# Patient Record
Sex: Male | Born: 1995 | Race: White | Hispanic: No | Marital: Single | Smoking: Former smoker
Health system: Southern US, Community
[De-identification: ages and names within clinical notes are randomized; demographics above are authoritative.]

## PROBLEM LIST (undated history)

## (undated) DIAGNOSIS — N200 Calculus of kidney: Secondary | ICD-10-CM

---
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
## 2014-03-06 ENCOUNTER — Emergency Department (HOSPITAL_BASED_OUTPATIENT_CLINIC_OR_DEPARTMENT_OTHER)
Admission: EM | Admit: 2014-03-06 | Discharge: 2014-03-06 | Disposition: A | Discharge status: Home / Self Care | Payer: Self-pay | Attending: Emergency Medicine | Admitting: Emergency Medicine

## 2014-03-06 ENCOUNTER — Emergency Department (HOSPITAL_BASED_OUTPATIENT_CLINIC_OR_DEPARTMENT_OTHER): Payer: Self-pay

## 2014-03-06 ENCOUNTER — Encounter (HOSPITAL_BASED_OUTPATIENT_CLINIC_OR_DEPARTMENT_OTHER): Payer: Self-pay | Admitting: *Deleted

## 2014-03-06 ENCOUNTER — Emergency Department (HOSPITAL_BASED_OUTPATIENT_CLINIC_OR_DEPARTMENT_OTHER): Payer: Medicaid Other

## 2014-03-06 DIAGNOSIS — N23 Unspecified renal colic: Secondary | ICD-10-CM | POA: Insufficient documentation

## 2014-03-06 DIAGNOSIS — Z791 Long term (current) use of non-steroidal anti-inflammatories (NSAID): Secondary | ICD-10-CM | POA: Insufficient documentation

## 2014-03-06 DIAGNOSIS — Z87891 Personal history of nicotine dependence: Secondary | ICD-10-CM | POA: Insufficient documentation

## 2014-03-06 DIAGNOSIS — R109 Unspecified abdominal pain: Secondary | ICD-10-CM

## 2014-03-06 DIAGNOSIS — Z79899 Other long term (current) drug therapy: Secondary | ICD-10-CM | POA: Insufficient documentation

## 2014-03-06 LAB — CBC WITH DIFFERENTIAL/PLATELET
Basophils Absolute: 0 10*3/uL (ref 0.0–0.1)
Basophils Relative: 0 % (ref 0–1)
Eosinophils Absolute: 0 10*3/uL (ref 0.0–0.7)
Eosinophils Relative: 0 % (ref 0–5)
HEMATOCRIT: 43.3 % (ref 39.0–52.0)
HEMOGLOBIN: 14.7 g/dL (ref 13.0–17.0)
LYMPHS PCT: 19 % (ref 12–46)
Lymphs Abs: 1.4 10*3/uL (ref 0.7–4.0)
MCH: 29.9 pg (ref 26.0–34.0)
MCHC: 33.9 g/dL (ref 30.0–36.0)
MCV: 88 fL (ref 78.0–100.0)
Monocytes Absolute: 0.5 10*3/uL (ref 0.1–1.0)
Monocytes Relative: 7 % (ref 3–12)
NEUTROS ABS: 5.3 10*3/uL (ref 1.7–7.7)
Neutrophils Relative %: 74 % (ref 43–77)
Platelets: 305 10*3/uL (ref 150–400)
RBC: 4.92 MIL/uL (ref 4.22–5.81)
RDW: 12.5 % (ref 11.5–15.5)
WBC: 7.4 10*3/uL (ref 4.0–10.5)

## 2014-03-06 LAB — COMPREHENSIVE METABOLIC PANEL
ALBUMIN: 4.2 g/dL (ref 3.5–5.2)
ALK PHOS: 68 U/L (ref 39–117)
ALT: 11 U/L (ref 0–53)
AST: 20 U/L (ref 0–37)
Anion gap: 8 (ref 5–15)
BILIRUBIN TOTAL: 0.8 mg/dL (ref 0.3–1.2)
BUN: 10 mg/dL (ref 6–23)
CHLORIDE: 104 meq/L (ref 96–112)
CO2: 26 mmol/L (ref 19–32)
CREATININE: 0.75 mg/dL (ref 0.50–1.35)
Calcium: 8.4 mg/dL (ref 8.4–10.5)
GLUCOSE: 126 mg/dL — AB (ref 70–99)
POTASSIUM: 3.4 mmol/L — AB (ref 3.5–5.1)
Sodium: 138 mmol/L (ref 135–145)
Total Protein: 7.3 g/dL (ref 6.0–8.3)

## 2014-03-06 LAB — URINALYSIS, ROUTINE W REFLEX MICROSCOPIC
BILIRUBIN URINE: NEGATIVE
Glucose, UA: NEGATIVE mg/dL
KETONES UR: NEGATIVE mg/dL
Leukocytes, UA: NEGATIVE
NITRITE: NEGATIVE
Protein, ur: NEGATIVE mg/dL
SPECIFIC GRAVITY, URINE: 1.014 (ref 1.005–1.030)
UROBILINOGEN UA: 0.2 mg/dL (ref 0.0–1.0)
pH: 7 (ref 5.0–8.0)

## 2014-03-06 LAB — URINE MICROSCOPIC-ADD ON

## 2014-03-06 MED ORDER — KETOROLAC TROMETHAMINE 30 MG/ML IJ SOLN
30.0000 mg | Freq: Once | INTRAMUSCULAR | Status: AC
  Administered 2014-03-06: 30 mg via INTRAVENOUS
  Filled 2014-03-06: qty 1

## 2014-03-06 MED ORDER — HYDROMORPHONE HCL 1 MG/ML IJ SOLN
1.0000 mg | Freq: Once | INTRAMUSCULAR | Status: AC
  Administered 2014-03-06: 1 mg via INTRAVENOUS
  Filled 2014-03-06: qty 1

## 2014-03-06 MED ORDER — OXYCODONE-ACETAMINOPHEN 5-325 MG PO TABS: 2.0000 | ORAL_TABLET | ORAL | Status: AC | PRN

## 2014-03-06 MED ORDER — SODIUM CHLORIDE 0.9 % IV BOLUS (SEPSIS)
1000.0000 mL | Freq: Once | INTRAVENOUS | Status: AC
  Administered 2014-03-06: 1000 mL via INTRAVENOUS

## 2014-03-06 MED ORDER — MORPHINE SULFATE 4 MG/ML IJ SOLN
4.0000 mg | Freq: Once | INTRAMUSCULAR | Status: AC
  Administered 2014-03-06: 4 mg via INTRAVENOUS
  Filled 2014-03-06: qty 1

## 2014-03-06 MED ORDER — PROMETHAZINE HCL 25 MG PO TABS: 25.0000 mg | ORAL_TABLET | Freq: Four times a day (QID) | ORAL | Status: AC | PRN

## 2014-03-06 MED ORDER — ONDANSETRON HCL 4 MG/2ML IJ SOLN
4.0000 mg | Freq: Once | INTRAMUSCULAR | Status: AC
  Administered 2014-03-06: 4 mg via INTRAVENOUS
  Filled 2014-03-06: qty 2

## 2014-03-06 MED ORDER — SODIUM CHLORIDE 0.9 % IV BOLUS (SEPSIS)
500.0000 mL | Freq: Once | INTRAVENOUS | Status: AC
  Administered 2014-03-06: 500 mL via INTRAVENOUS

## 2014-03-06 NOTE — ED Notes (Signed)
MD at bedside. 

## 2014-03-06 NOTE — ED Provider Notes (Signed)
CSN: [id number]     Arrival date & time 03/06/14  0847 History   First MD Initiated Contact with Patient 03/06/14 [id number]     Chief Complaint  Patient presents with  . Abdominal Pain     (Consider location/radiation/quality/duration/timing/severity/associated sxs/prior Treatment) HPI Comments: Patient presents with left flank pain. He has a history of kidney stones. He's had multiple kidney stones over a 5-6 year period. He states he's passed all of his prior kidney stones and has not had urology follow-up. He's had 3 past CT scans that have shown ureteral stones in the past. He has had sudden onset of pain in his left flank that started about 7:00 this morning. His pain is been constant and unchanged since that time. He's had associated nausea and vomiting. He denies any urinary symptoms. He denies any fevers. His pain is sharp and radiates to his back. He denies any testicular pain. He took a Microbiologistercocet prior to arrival but he threw it up.  Patient is a 19 y.o. male presenting with abdominal pain.  Abdominal Pain Associated symptoms: nausea and vomiting   Associated symptoms: no chest pain, no chills, no cough, no diarrhea, no fatigue, no fever, no hematuria and no shortness of breath     Past Medical History  Diagnosis Date  . Kidney stone   . Kidney stones    History reviewed. No pertinent past surgical history. No family history on file. History  Substance Use Topics  . Smoking status: Former Games developermoker  . Smokeless tobacco: Not on file  . Alcohol Use: No    Review of Systems  Constitutional: Negative for fever, chills, diaphoresis and fatigue.  HENT: Negative for congestion, rhinorrhea and sneezing.   Eyes: Negative.   Respiratory: Negative for cough, chest tightness and shortness of breath.   Cardiovascular: Negative for chest pain and leg swelling.  Gastrointestinal: Positive for nausea, vomiting and abdominal pain. Negative for diarrhea and blood in stool.  Genitourinary:  Positive for flank pain. Negative for frequency, hematuria and difficulty urinating.  Musculoskeletal: Positive for back pain. Negative for arthralgias.  Skin: Negative for rash.  Neurological: Negative for dizziness, speech difficulty, weakness, numbness and headaches.      Allergies  Review of patient's allergies indicates no known allergies.  Home Medications   Prior to Admission medications   Medication Sig Start Date End Date Taking? Authorizing Provider  ibuprofen (ADVIL,MOTRIN) 800 MG tablet Take 1 tablet (800 mg total) by mouth 3 (three) times daily. 08/15/12   Glynn OctaveStephen Rancour, MD  ondansetron (ZOFRAN ODT) 4 MG disintegrating tablet Take 1 tablet (4 mg total) by mouth every 8 (eight) hours as needed for nausea or vomiting. 09/26/13   Teressa LowerVrinda Pickering, NP  ondansetron (ZOFRAN ODT) 8 MG disintegrating tablet Take 1 tablet (8 mg total) by mouth every 8 (eight) hours as needed for nausea or vomiting. 12/29/12   Hilario Quarryanielle S Ray, MD  ondansetron (ZOFRAN) 4 MG tablet Take 1 tablet (4 mg total) by mouth every 6 (six) hours. 08/15/12   Glynn OctaveStephen Rancour, MD  ondansetron (ZOFRAN) 4 MG tablet Take 1 tablet (4 mg total) by mouth every 8 (eight) hours as needed for nausea. 08/21/12   Jennifer L Piepenbrink, PA-C  oxyCODONE-acetaminophen (PERCOCET) 5-325 MG per tablet Take 2 tablets by mouth every 4 (four) hours as needed. 03/06/14   Rolan BuccoMelanie Remona Boom, MD  promethazine (PHENERGAN) 25 MG tablet Take 1 tablet (25 mg total) by mouth every 6 (six) hours as needed for nausea. 09/19/10 09/26/10  Leonette Mostharles  Raiford Simmonds, MD  promethazine (PHENERGAN) 25 MG tablet Take 1 tablet (25 mg total) by mouth every 6 (six) hours as needed for nausea or vomiting. 03/06/14   Rolan Bucco, MD  tamsulosin (FLOMAX) 0.4 MG CAPS Take 1 capsule (0.4 mg total) by mouth daily. 08/21/12   Jennifer L Piepenbrink, PA-C   BP 117/71 mmHg  Pulse 76  Temp(Src) 97.5 F (36.4 C) (Oral)  Resp 18  Ht  (1.651 m)  SpO2 99% Physical Exam   Constitutional: He is oriented to person, place, and time. He appears well-developed and well-nourished.  HENT:  Head: Normocephalic and atraumatic.  Eyes: Pupils are equal, round, and reactive to light.  Neck: Normal range of motion. Neck supple.  Cardiovascular: Normal rate, regular rhythm and normal heart sounds.   Pulmonary/Chest: Effort normal and breath sounds normal. No respiratory distress. He has no wheezes. He has no rales. He exhibits no tenderness.  Abdominal: Soft. Bowel sounds are normal. There is tenderness (positive tenderness in the left midabdomen). There is no rebound and no guarding.  Musculoskeletal: Normal range of motion. He exhibits no edema.  Lymphadenopathy:    He has no cervical adenopathy.  Neurological: He is alert and oriented to person, place, and time.  Skin: Skin is warm and dry. No rash noted.  Psychiatric: He has a normal mood and affect.    ED Course  Procedures (including critical care time) Labs Review Labs Reviewed  URINALYSIS, ROUTINE W REFLEX MICROSCOPIC - Abnormal; Notable for the following:    Hgb urine dipstick SMALL (*)    All other components within normal limits  COMPREHENSIVE METABOLIC PANEL - Abnormal; Notable for the following:    Potassium 3.4 (*)    Glucose, Bld 126 (*)    All other components within normal limits  URINE MICROSCOPIC-ADD ON - Abnormal; Notable for the following:    Bacteria, UA FEW (*)    All other components within normal limits  CBC WITH DIFFERENTIAL    Imaging Review Dg Abd 1 View  03/06/2014   CLINICAL DATA:  Left lower quadrant pain since this morning  EXAM: ABDOMEN - 1 VIEW  COMPARISON:  09/26/2013  FINDINGS: Scattered large and small bowel gas. No obstructive changes are seen. Fecal material is noted within the right colon. No abnormal mass or abnormal calcifications are seen. No bony abnormality is noted.  IMPRESSION: No acute abnormality seen.   Electronically Signed   By: Alcide Clever M.D.   On:  03/06/2014 09:43     EKG Interpretation None      MDM   Final diagnoses:  Abdominal pain  Renal colic    Patient symptoms are consistent with his past renal colic. I don't feel he needs another CT scan as these had 3 in recent years. His pain is controlled after pain medications in the ED. He has no evidence of infection. His creatinine is normal. He was encouraged to have urology follow-up. He was discharged home with a prescription for Percocet and Phenergan. Return precautions were given.    Rolan Bucco, MD 03/06/14 1440

## 2014-03-06 NOTE — Discharge Instructions (Signed)

## 2014-03-06 NOTE — ED Notes (Signed)
Pt c/o left side abd pain and vomiting since this am. Pt has hx of kidney stones and sts this feels similar but the pain is worse.

## 2016-01-31 ENCOUNTER — Emergency Department (HOSPITAL_BASED_OUTPATIENT_CLINIC_OR_DEPARTMENT_OTHER): Payer: Self-pay

## 2016-01-31 ENCOUNTER — Emergency Department (HOSPITAL_BASED_OUTPATIENT_CLINIC_OR_DEPARTMENT_OTHER)
Admission: EM | Admit: 2016-01-31 | Discharge: 2016-01-31 | Disposition: A | Discharge status: Home / Self Care | Payer: Self-pay | Attending: Emergency Medicine | Admitting: Emergency Medicine

## 2016-01-31 ENCOUNTER — Encounter (HOSPITAL_BASED_OUTPATIENT_CLINIC_OR_DEPARTMENT_OTHER): Payer: Self-pay | Admitting: Emergency Medicine

## 2016-01-31 DIAGNOSIS — N200 Calculus of kidney: Secondary | ICD-10-CM | POA: Insufficient documentation

## 2016-01-31 DIAGNOSIS — Z791 Long term (current) use of non-steroidal anti-inflammatories (NSAID): Secondary | ICD-10-CM | POA: Insufficient documentation

## 2016-01-31 DIAGNOSIS — R319 Hematuria, unspecified: Secondary | ICD-10-CM

## 2016-01-31 DIAGNOSIS — Z79899 Other long term (current) drug therapy: Secondary | ICD-10-CM | POA: Insufficient documentation

## 2016-01-31 DIAGNOSIS — Z87891 Personal history of nicotine dependence: Secondary | ICD-10-CM | POA: Insufficient documentation

## 2016-01-31 DIAGNOSIS — R109 Unspecified abdominal pain: Secondary | ICD-10-CM

## 2016-01-31 LAB — URINALYSIS, ROUTINE W REFLEX MICROSCOPIC
BILIRUBIN URINE: NEGATIVE
GLUCOSE, UA: NEGATIVE mg/dL
Ketones, ur: NEGATIVE mg/dL
Leukocytes, UA: NEGATIVE
Nitrite: NEGATIVE
PROTEIN: NEGATIVE mg/dL
Specific Gravity, Urine: 1.019 (ref 1.005–1.030)
pH: 5.5 (ref 5.0–8.0)

## 2016-01-31 LAB — URINALYSIS, MICROSCOPIC (REFLEX)

## 2016-01-31 MED ORDER — HYDROCODONE-ACETAMINOPHEN 5-325 MG PO TABS: 1.0000 | ORAL_TABLET | ORAL | 0 refills | Status: AC | PRN

## 2016-01-31 MED ORDER — ONDANSETRON 4 MG PO TBDP: 4.0000 mg | ORAL_TABLET | Freq: Three times a day (TID) | ORAL | 0 refills | Status: AC | PRN

## 2016-01-31 NOTE — ED Notes (Signed)
Discharge instructions and prescriptions reviewed - voiced ;understanding- urine strainer given.

## 2016-01-31 NOTE — ED Provider Notes (Signed)
[facility] Note   CSN: [id number] Arrival date & time: 01/31/16  1720  By signing my name below, I, [name], attest that this documentation has been prepared under the direction and in the presence of Sharilyn SitesLisa Elanda Garmany, PA-C. Electronically Signed: [name], ED Scribe. 01/31/16. 6:28 PM.  History   Chief Complaint Chief Complaint  Patient presents with  . Flank Pain   The history is provided by the patient. No language interpreter was used.   HPI Comments: Christopher Zhang is a 20 y.o. male with PMHx of Kidney stones who presents to the Emergency Department complaining of gradual onset, fluctuating left flank pain onset 3-4 days ago. Pt awoke today with pain reaching 10/10, but pain has gradually improved since arriving in ED. He states pain is similar to flank pain from prior kidney stones. Pt has passed all prior kidney stones without need for surgical intervention. No treatments tried at this time. Pt reports associated nausea, diaphoresis and intermittent abdominal pain. He has not eaten today. Per family, pt does not consume enough water at home. He has not had any recent imaging. Pt denies vomiting, dysuria, hematuria, fever. Pt does not follow with a Insurance underwriterUrologist.  Past Medical History:  Diagnosis Date  . Kidney stone   . Kidney stones     There are no active problems to display for this patient.   History reviewed. No pertinent surgical history.   Home Medications    Prior to Admission medications   Medication Sig Start Date End Date Taking? Authorizing Provider  ibuprofen (ADVIL,MOTRIN) 800 MG tablet Take 1 tablet (800 mg total) by mouth 3 (three) times daily. 08/15/12   Glynn OctaveStephen Rancour, MD  ondansetron (ZOFRAN ODT) 4 MG disintegrating tablet Take 1 tablet (4 mg total) by mouth every 8 (eight) hours as needed for nausea or vomiting. 09/26/13   Teressa LowerVrinda Pickering, NP  ondansetron (ZOFRAN ODT) 8 MG disintegrating tablet Take 1 tablet (8 mg total) by  mouth every 8 (eight) hours as needed for nausea or vomiting. 12/29/12   Margarita Grizzleanielle Ray, MD  ondansetron (ZOFRAN) 4 MG tablet Take 1 tablet (4 mg total) by mouth every 6 (six) hours. 08/15/12   Glynn OctaveStephen Rancour, MD  ondansetron (ZOFRAN) 4 MG tablet Take 1 tablet (4 mg total) by mouth every 8 (eight) hours as needed for nausea. 08/21/12   Jennifer Piepenbrink, PA-C  oxyCODONE-acetaminophen (PERCOCET) 5-325 MG per tablet Take 2 tablets by mouth every 4 (four) hours as needed. 03/06/14   Rolan BuccoMelanie Belfi, MD  promethazine (PHENERGAN) 25 MG tablet Take 1 tablet (25 mg total) by mouth every 6 (six) hours as needed for nausea. 09/19/10 09/26/10  Susy Frizzleharles Sheldon, MD  promethazine (PHENERGAN) 25 MG tablet Take 1 tablet (25 mg total) by mouth every 6 (six) hours as needed for nausea or vomiting. 03/06/14   Rolan BuccoMelanie Belfi, MD  tamsulosin (FLOMAX) 0.4 MG CAPS Take 1 capsule (0.4 mg total) by mouth daily. 08/21/12   Francee PiccoloJennifer Piepenbrink, PA-C    Family History No family history on file.  Social History Social History  Substance Use Topics  . Smoking status: Former Games developermoker  . Smokeless tobacco: Never Used  . Alcohol use No     Allergies   Patient has no known allergies.   Review of Systems Review of Systems  Constitutional: Negative for fever.  Gastrointestinal: Positive for nausea. Negative for abdominal pain and vomiting.  Genitourinary: Positive for flank pain. Negative for dysuria and hematuria.  All other systems reviewed and  are negative.    Physical Exam Updated Vital Signs BP 123/93 (BP Location: Left Arm)   Pulse 79   Temp 98 F (36.7 C) (Oral)   Resp 19   Ht 5\' 5"  (1.651 m)   Wt 161 lb 8 oz (73.3 kg)   SpO2 100%   BMI 26.88 kg/m   Physical Exam  Constitutional: He is oriented to person, place, and time. He appears well-developed and well-nourished.  Sitting upright in bed, appears comfortable, no distress  HENT:  Head: Normocephalic and atraumatic.  Mouth/Throat: Oropharynx is clear and  moist.  Eyes: Conjunctivae and EOM are normal. Pupils are equal, round, and reactive to light.  Neck: Normal range of motion.  Cardiovascular: Normal rate, regular rhythm and normal heart sounds.   Pulmonary/Chest: Effort normal and breath sounds normal.  Abdominal: Soft. Bowel sounds are normal. There is no tenderness. There is no rigidity and no guarding.  Abdomen soft, nondistended, mild tenderness of the left flank; no rebound or guarding, no peritonitis  Musculoskeletal: Normal range of motion.  Neurological: He is alert and oriented to person, place, and time.  Skin: Skin is warm and dry.  Psychiatric: He has a normal mood and affect.  Nursing note and vitals reviewed.    ED Treatments / Results  DIAGNOSTIC STUDIES: Oxygen Saturation is 100% on RA, normal by my interpretation.    COORDINATION OF CARE: 6:22 PM Discussed treatment plan with pt at bedside which includes CT Scan and pt agreed to plan.  Labs (all labs ordered are listed, but only abnormal results are displayed) Labs Reviewed  URINALYSIS, ROUTINE W REFLEX MICROSCOPIC - Abnormal; Notable for the following:       Result Value   APPearance CLOUDY (*)    Hgb urine dipstick LARGE (*)    All other components within normal limits  URINALYSIS, MICROSCOPIC (REFLEX) - Abnormal; Notable for the following:    Bacteria, UA FEW (*)    Squamous Epithelial / LPF 0-5 (*)    All other components within normal limits    EKG  EKG Interpretation None       Radiology Ct Renal Stone Study  Result Date: 01/31/2016 CLINICAL DATA:  History of kidney stones.  Left flank pain. EXAM: CT ABDOMEN AND PELVIS WITHOUT CONTRAST TECHNIQUE: Multidetector CT imaging of the abdomen and pelvis was performed following the standard protocol without IV contrast. COMPARISON:  08/15/2012 FINDINGS: Lower chest: No acute abnormality. Hepatobiliary: No focal liver abnormality is seen. No gallstones, gallbladder wall thickening, or biliary dilatation.  Pancreas: Unremarkable. No pancreatic ductal dilatation or surrounding inflammatory changes. Spleen: Normal in size without focal abnormality. Adrenals/Urinary Tract: 2 mm nonobstructive calculus in the inferior pole of the right kidney. No hydronephrosis or hydroureter on the right. 4 mm calculus in the area of the left trigone with mild left hydroureter and hydronephrosis. No significant inflammatory perirenal or periureteral changes. Stomach/Bowel: Stomach is within normal limits. No evidence of appendicitis. No evidence of bowel wall thickening, distention, or inflammatory changes. Vascular/Lymphatic: No significant vascular findings are present. No enlarged abdominal or pelvic lymph nodes. Reproductive: Prostate is unremarkable. Other: No abdominal wall hernia or abnormality. No abdominopelvic ascites. Musculoskeletal: No acute or significant osseous findings. IMPRESSION: 4 mm calculus within the left trigone, causing mild obstructive uropathy. It is difficult to determine whether this calculus is located within the intravesicular portion of the left ureter or it is free within the urinary bladder. 2 mm nonobstructive right renal calculus. Electronically Signed   By: Sharlyne Pacas  Dimitrova M.D.   On: 01/31/2016 19:25    Procedures Procedures (including critical care time)  Medications Ordered in ED Medications - No data to display   Initial Impression / Assessment and Plan / ED Course  I have reviewed the triage vital signs and the nursing notes.  Pertinent labs & imaging results that were available during my care of the patient were reviewed by me and considered in my medical decision making (see chart for details).  Clinical Course    20 year old male here with left flank pain. Long history of kidney stones, reports this feels the same. On arrival here, pain has decreased from onset. He is sitting in bed comfortably at this time. He does have mild tenderness of the left flank. His UA has large  blood with few bacteria, no overt infection. He has not had any recent imaging, therefore CT renal study obtained revealing 4 mm left calculus, likely in the urinary bladder. Patient has remained stable here, has not required any pain or nausea medications. He is tolerating oral fluids well. Feel he is stable for discharge. Will plan for symptomatic control. Discharged with urine strainer to monitor for passage of stone. He was given urology follow-up if any acute complications.  Discussed plan with patient and parents at bedside, they all acknowledged understanding and agreed with plan of care.  Return precautions given for new or worsening symptoms.  Final Clinical Impressions(s) / ED Diagnoses   Final diagnoses:  Kidney stones  Left flank pain  Hematuria, unspecified type    New Prescriptions New Prescriptions   HYDROCODONE-ACETAMINOPHEN (NORCO/VICODIN) 5-325 MG TABLET    Take 1 tablet by mouth every 4 (four) hours as needed.   ONDANSETRON (ZOFRAN ODT) 4 MG DISINTEGRATING TABLET    Take 1 tablet (4 mg total) by mouth every 8 (eight) hours as needed for nausea.   I personally performed the services described in this documentation, which was scribed in my presence. The recorded information has been reviewed and is accurate.   Garlon HatchetLisa M Steed Kanaan, PA-C 01/31/16 2015    Vanetta MuldersScott Zackowski, MD 02/01/16 [phone]

## 2016-01-31 NOTE — ED Triage Notes (Signed)
Pt h/o kidney stones, states left flank pain for past 3-4 days.  Pt states this pain feels similar to stones he's had in the past.

## 2016-01-31 NOTE — Discharge Instructions (Signed)
Take the prescribed medication as directed.  Strain urine at home to monitor for passage of stone. Follow-up with urology office if you continue having issues. Return to the ED for new or worsening symptoms.

## 2016-01-31 NOTE — ED Notes (Signed)
Patient transported to CT 

## 2016-01-31 NOTE — ED Notes (Signed)
Returned from radiology. 

## 2018-03-20 IMAGING — CT CT RENAL STONE PROTOCOL
2 of 4 series · 16 of 46 positions shown, 18 images · non-contrast
Comparison: 08/15/2012

CLINICAL DATA: History of kidney stones.  Left flank pain.

EXAM:
CT ABDOMEN AND PELVIS WITHOUT CONTRAST
TECHNIQUE: Multidetector CT imaging of the abdomen and pelvis was performed
following the standard protocol without IV contrast.

[Series 2: axial st · axial · 0.72mm/px · z∈[-238,+167]mm · 13 of 89 slices shown, 15 images]
[im 4/89  soft-tissue]
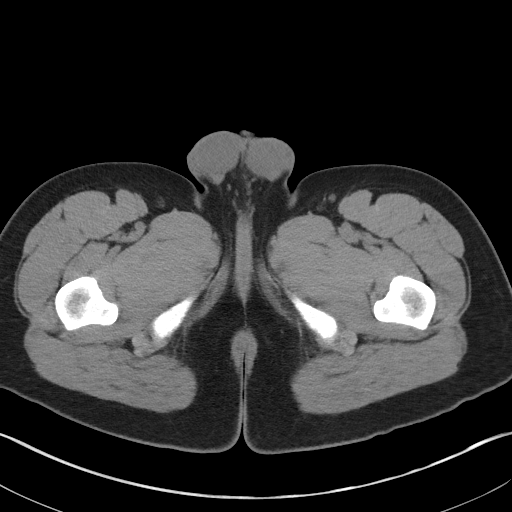
[im 4/89  bone]
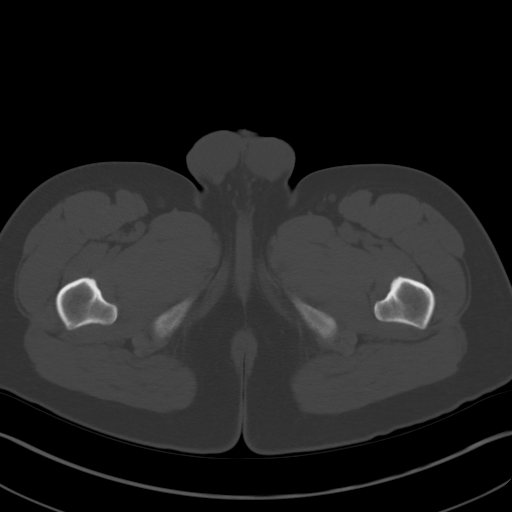
[im 12/89  soft-tissue]
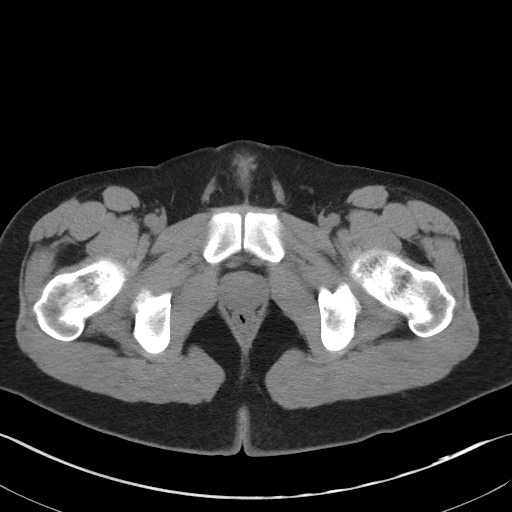
[im 19/89  soft-tissue]
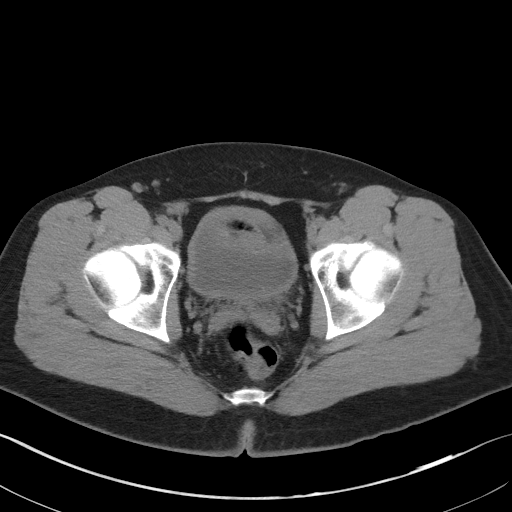
[im 26/89  soft-tissue]
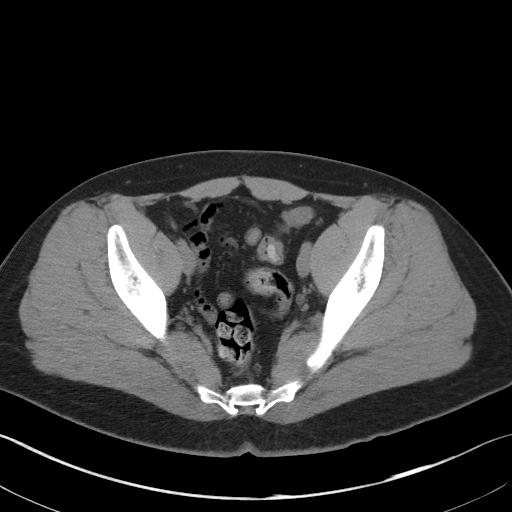
[im 30/89  soft-tissue]
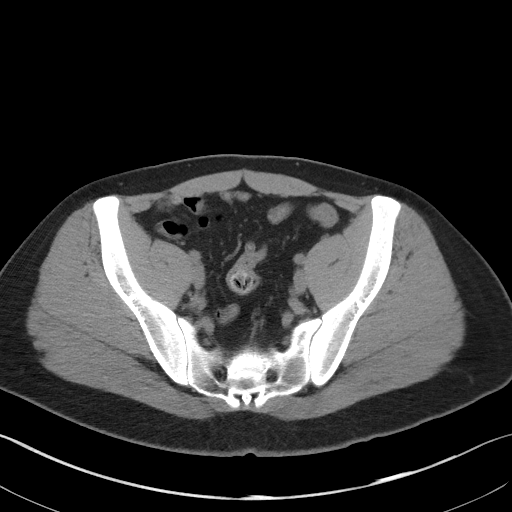
[im 37/89  soft-tissue]
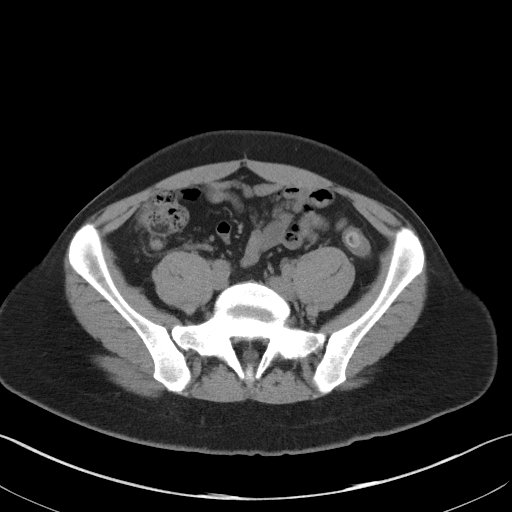
[im 45/89  soft-tissue]
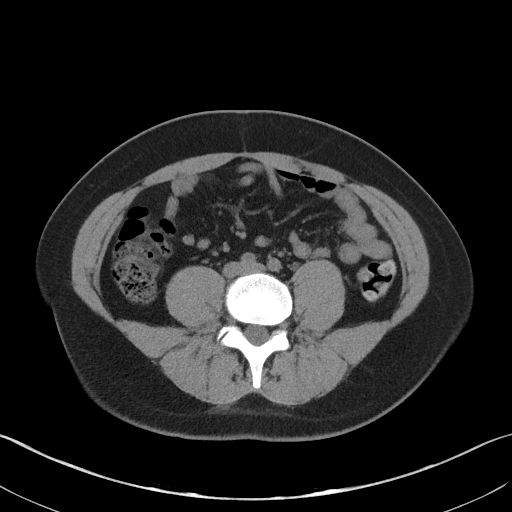
[im 52/89  soft-tissue]
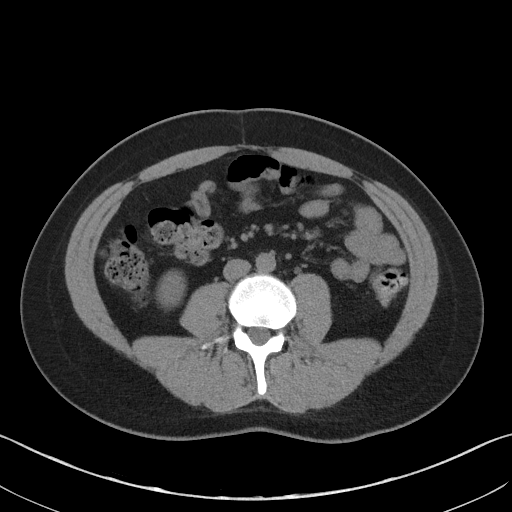
[im 59/89  soft-tissue]
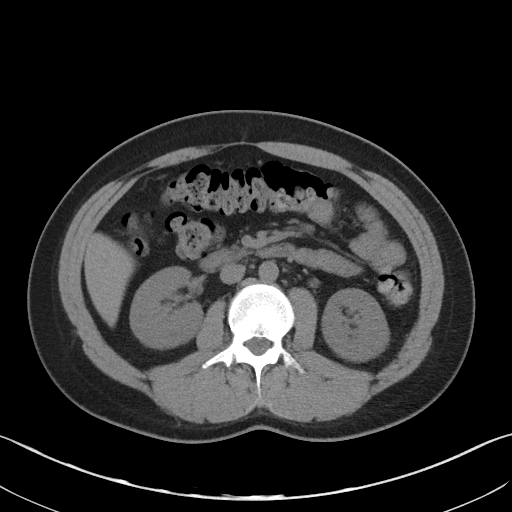
[im 59/89  bone]
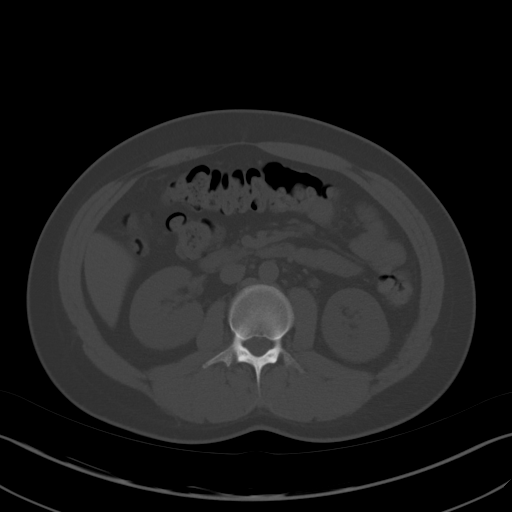
[im 63/89  soft-tissue]
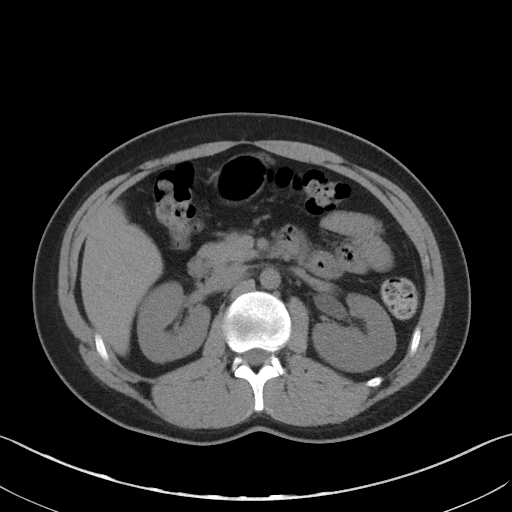
[im 70/89  soft-tissue]
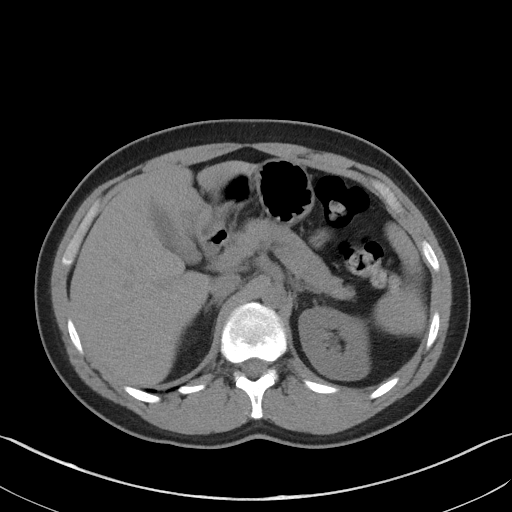
[im 78/89  soft-tissue]
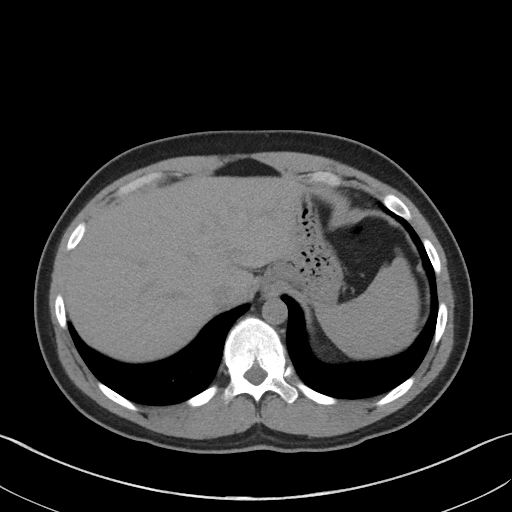
[im 85/89  soft-tissue]
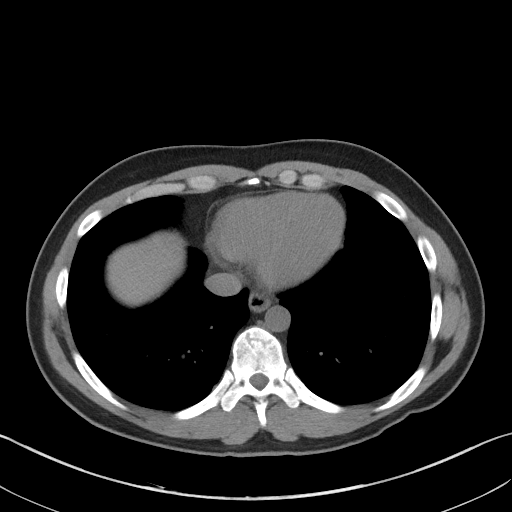

[Series 4: coronal st · coronal · 0.69mm/px · 3 of 101 slices shown]
[im 34/101  soft-tissue]
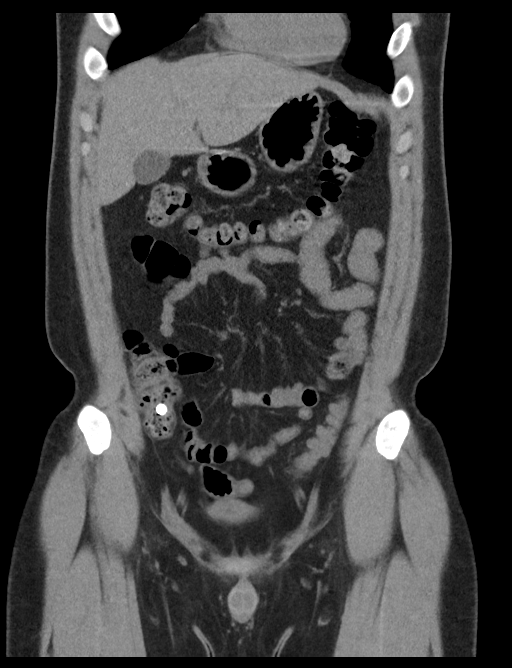
[im 45/101  soft-tissue]
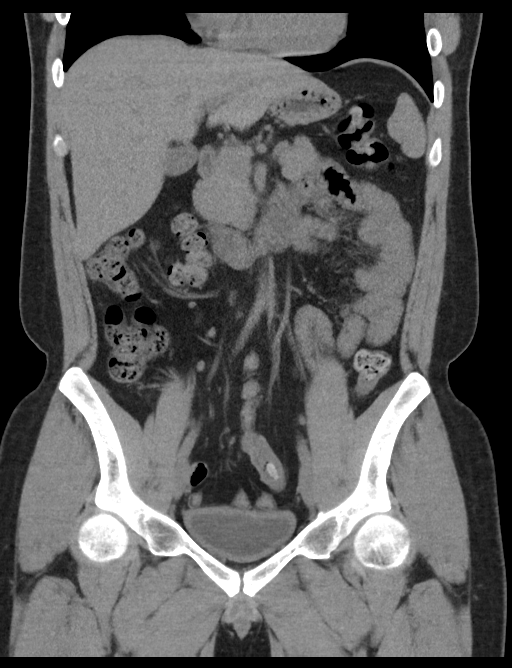
[im 56/101  soft-tissue]
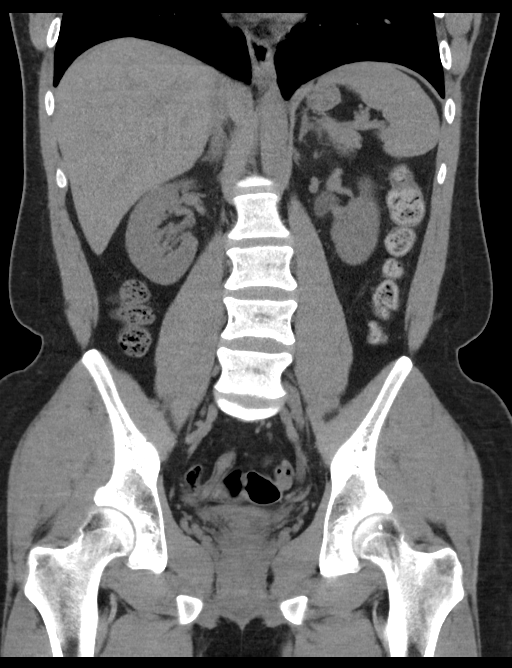

[16 of 46 positions shown; findings below may reference images not displayed]

FINDINGS: Lower chest: No acute abnormality.

Hepatobiliary: No focal liver abnormality is seen. No gallstones,
gallbladder wall thickening, or biliary dilatation.

Pancreas: Unremarkable. No pancreatic ductal dilatation or
surrounding inflammatory changes.

Spleen: Normal in size without focal abnormality.

Adrenals/Urinary Tract: 2 mm nonobstructive calculus in the inferior
pole of the right kidney. No hydronephrosis or hydroureter on the
right. 4 mm calculus in the area of the left trigone with mild left
hydroureter and hydronephrosis. No significant inflammatory
perirenal or periureteral changes.

Stomach/Bowel: Stomach is within normal limits. No evidence of
appendicitis. No evidence of bowel wall thickening, distention, or
inflammatory changes.

Vascular/Lymphatic: No significant vascular findings are present. No
enlarged abdominal or pelvic lymph nodes.

Reproductive: Prostate is unremarkable.

Other: No abdominal wall hernia or abnormality. No abdominopelvic
ascites.

Musculoskeletal: No acute or significant osseous findings.
IMPRESSION: 4 mm calculus within the left trigone, causing mild obstructive
uropathy. It is difficult to determine whether this calculus is
located within the intravesicular portion of the left ureter or it
is free within the urinary bladder.

2 mm nonobstructive right renal calculus.
